# Patient Record
Sex: Male | Born: 1975 | Hispanic: No | Marital: Single | Smoking: Never smoker
Health system: Southern US, Community
[De-identification: ages and names within clinical notes are randomized; demographics above are authoritative.]

---

## 2005-08-22 ENCOUNTER — Emergency Department (HOSPITAL_COMMUNITY): Admission: EM | Admit: 2005-08-22 | Discharge: 2005-08-22 | Payer: Self-pay | Admitting: *Deleted

## 2006-05-31 IMAGING — CT CT HEAD W/O CM
1 of 2 series · 14 of 30 positions shown, 18 images · IV contrast (agent unspecified)
Comparison: None.

CLINICAL DATA: Headache with nausea and vomiting.  
 HEAD CT WITHOUT CONTRAST:
TECHNIQUE: Contiguous axial images were obtained from the base of the skull through the vertex according to standard protocol without contrast.

[Series 2: brain · axial · 0.47mm/px · z∈[+148,+280]mm · 14 of 40 slices shown, 18 images]
[im 3/40  brain]
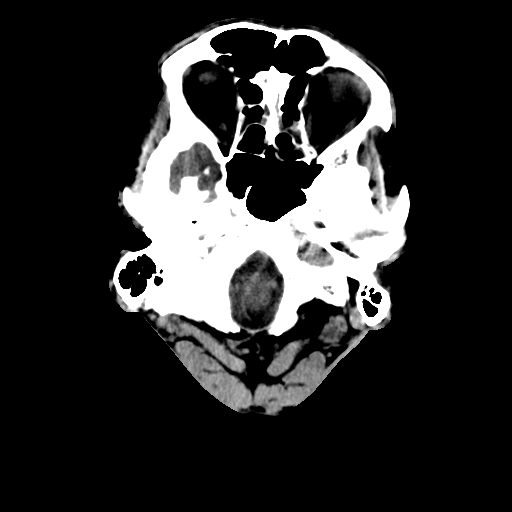
[im 3/40  bone]
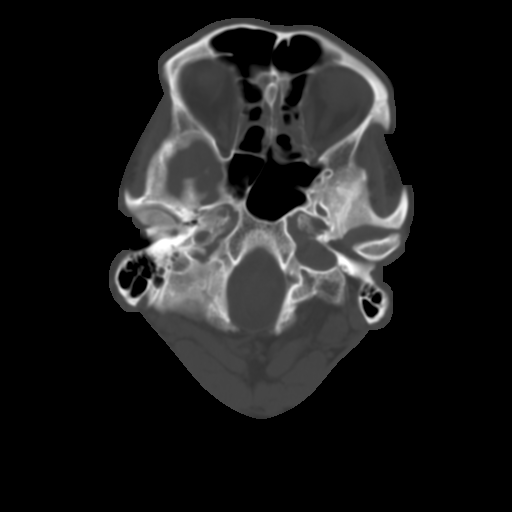
[im 6/40  brain]
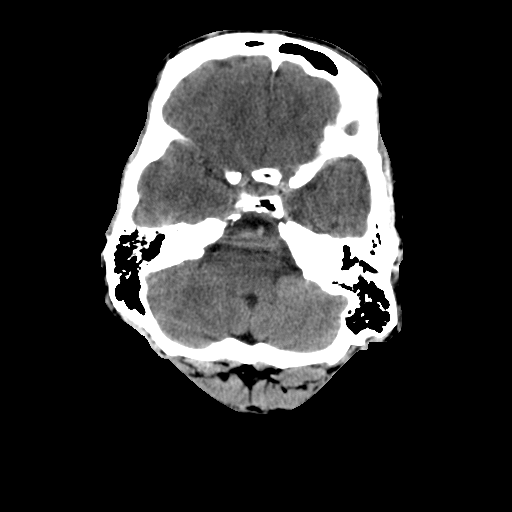
[im 8/40  brain]
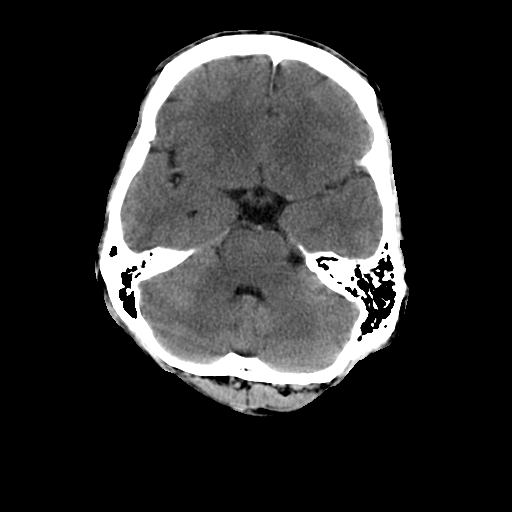
[im 11/40  brain]
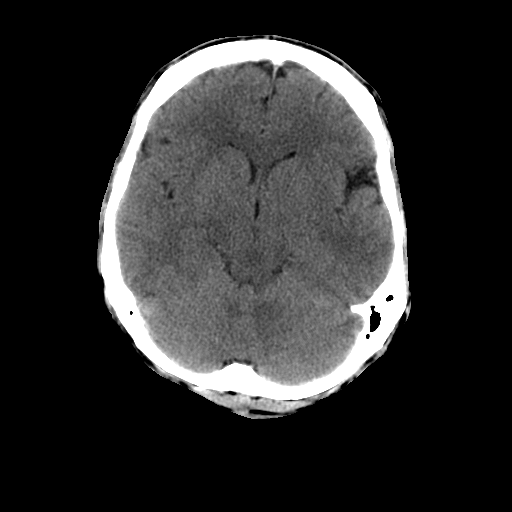
[im 14/40  brain]
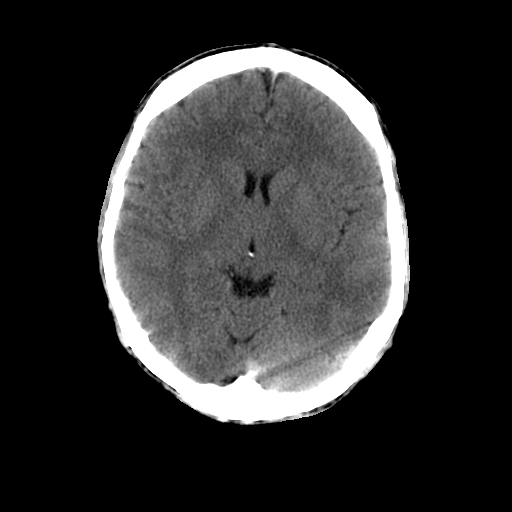
[im 14/40  bone]
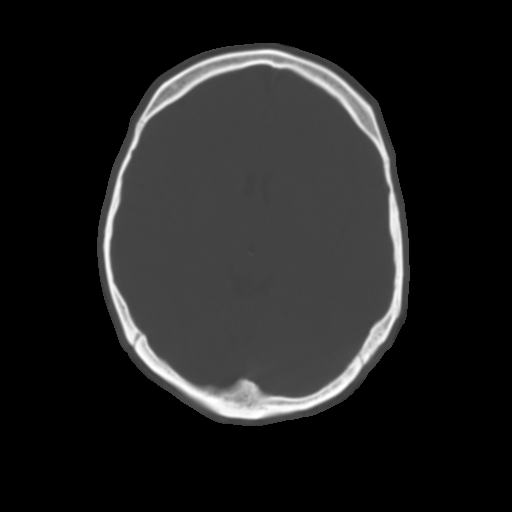
[im 16/40  brain]
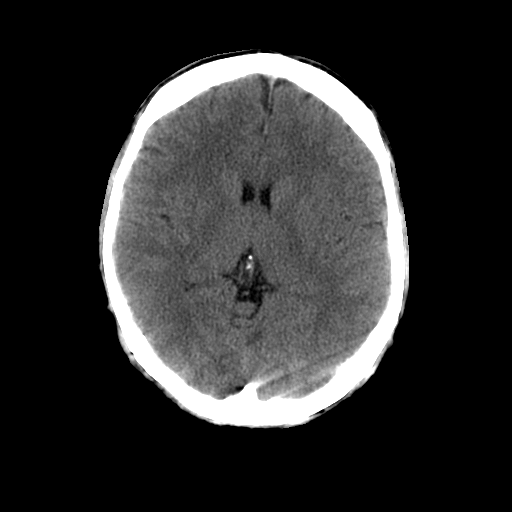
[im 19/40  brain]
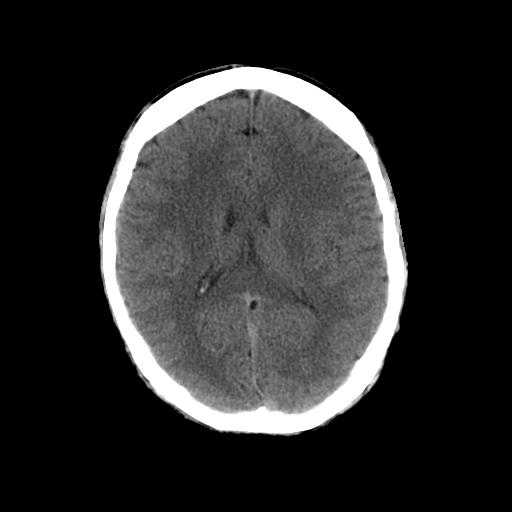
[im 21/40  brain]
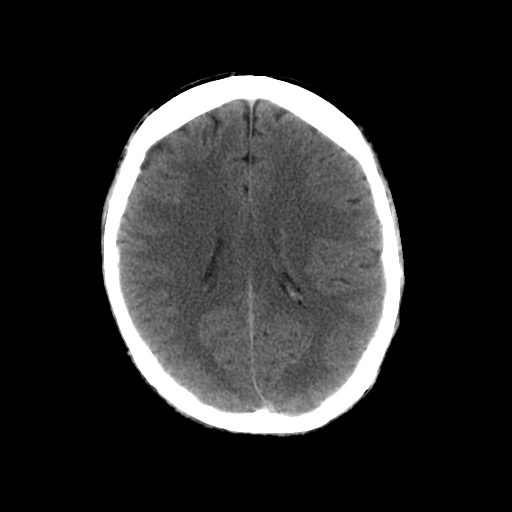
[im 24/40  brain]
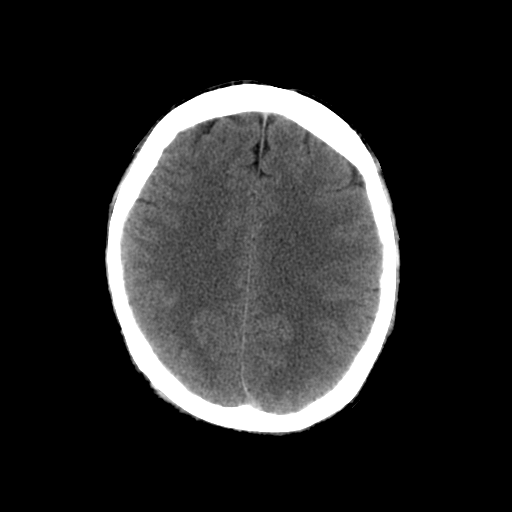
[im 24/40  bone]
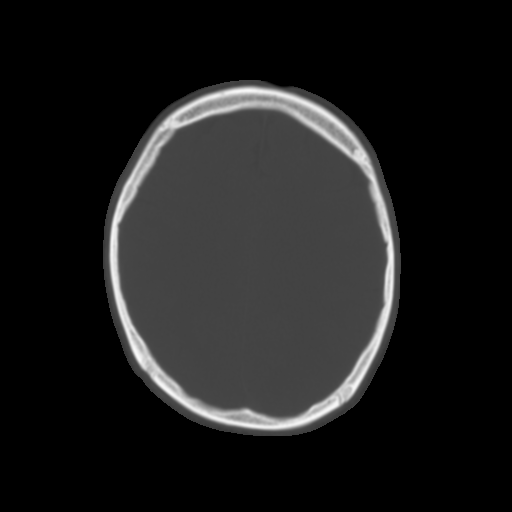
[im 27/40  brain]
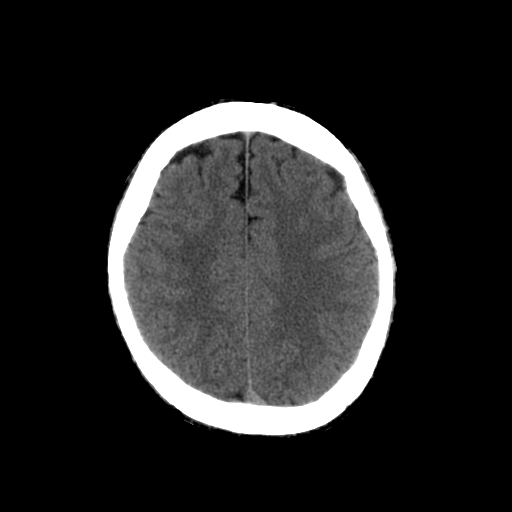
[im 29/40  brain]
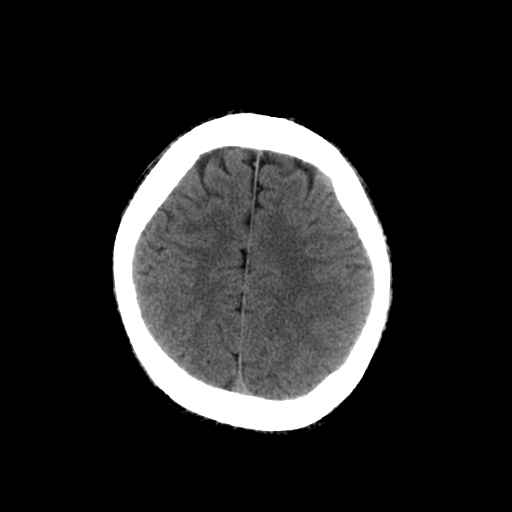
[im 32/40  brain]
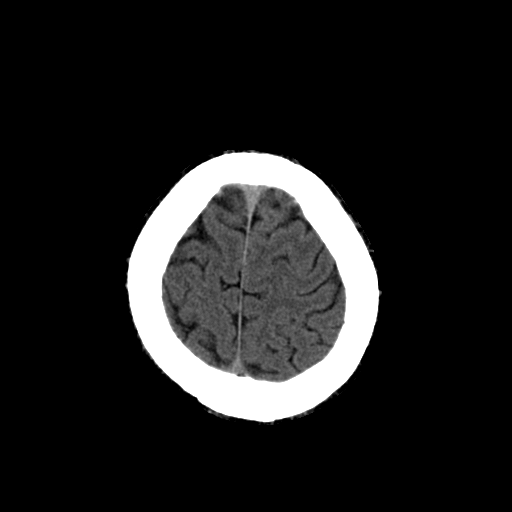
[im 34/40  brain]
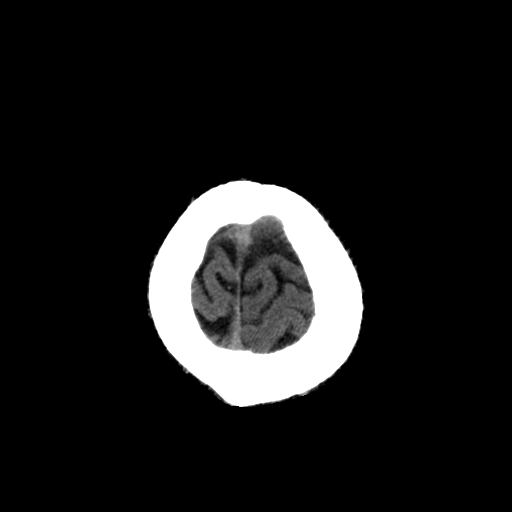
[im 34/40  bone]
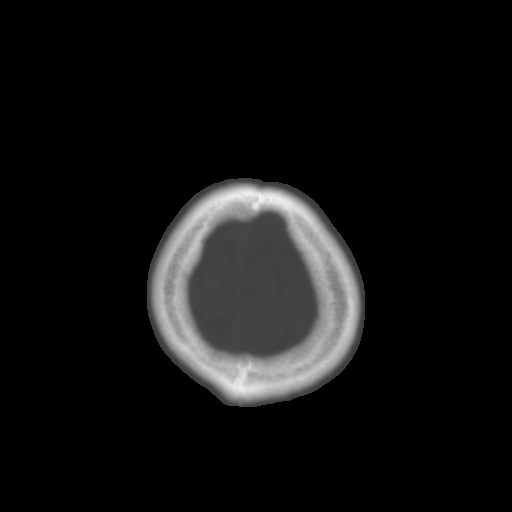
[im 37/40  brain]
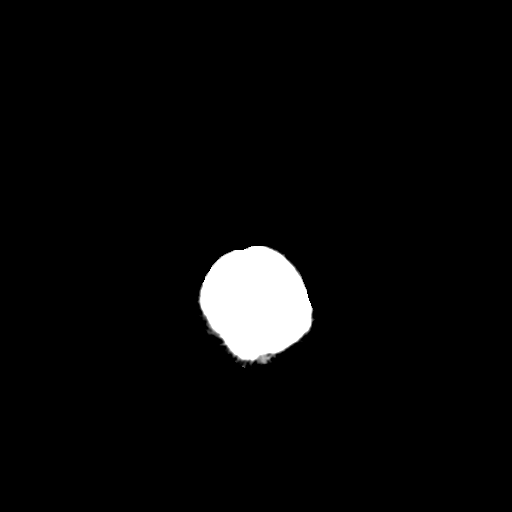

[14 of 30 positions shown; findings below may reference images not displayed]

FINDINGS: No comparison films available. 
 No evidence of acute intracranial abnormality including mass or mass effect, hydrocephalus, extraaxial fluid collection, midline shift, hemorrhage, or infarct.  Acute infarct may be missed by CT for 24 to 48 hours.  Mucosal thickening within scattered bilateral ethmoid air cells are noted.  The remainder of the visualized bony calvarium is unremarkable.
IMPRESSION: 1.  No evidence of acute intracranial abnormality. 
 2.  Ethmoid sinusitis.

## 2007-01-24 ENCOUNTER — Inpatient Hospital Stay (HOSPITAL_COMMUNITY): Admission: EM | Admit: 2007-01-24 | Discharge: 2007-01-25 | Payer: Self-pay | Admitting: Emergency Medicine

## 2007-01-24 ENCOUNTER — Ambulatory Visit: Payer: Self-pay | Admitting: Vascular Surgery

## 2016-01-15 ENCOUNTER — Encounter (HOSPITAL_COMMUNITY): Payer: Self-pay | Admitting: Emergency Medicine

## 2016-01-15 ENCOUNTER — Ambulatory Visit (HOSPITAL_COMMUNITY)
Admission: EM | Admit: 2016-01-15 | Discharge: 2016-01-15 | Disposition: A | Discharge status: Home / Self Care | Payer: BLUE CROSS/BLUE SHIELD | Attending: Emergency Medicine | Admitting: Emergency Medicine

## 2016-01-15 DIAGNOSIS — N390 Urinary tract infection, site not specified: Secondary | ICD-10-CM

## 2016-01-15 LAB — POCT URINALYSIS DIP (DEVICE)
Bilirubin Urine: NEGATIVE
Glucose, UA: NEGATIVE mg/dL
KETONES UR: NEGATIVE mg/dL
Nitrite: POSITIVE — AB
PH: 6 (ref 5.0–8.0)
PROTEIN: 100 mg/dL — AB
SPECIFIC GRAVITY, URINE: 1.025 (ref 1.005–1.030)
UROBILINOGEN UA: 1 mg/dL (ref 0.0–1.0)

## 2016-01-15 MED ORDER — LEVOFLOXACIN 500 MG PO TABS: 500.0000 mg | ORAL_TABLET | Freq: Every day | ORAL | Status: AC

## 2016-01-15 NOTE — ED Notes (Signed)
Patient c/o burning after urination onset yesterday. Patient reports he has had joint pain and fever. Patient is in NAD.

## 2016-01-15 NOTE — Discharge Instructions (Signed)
Antibiotic Medicine °Antibiotic medicines are used to treat infections caused by bacteria. They work by injuring or killing the bacteria that is making you sick. °HOW IS AN ANTIBIOTIC CHOSEN? °An antibiotic is chosen based on many factors. To help your health care provider choose one for you, tell your health care provider if: °· You have any allergies. °· You are pregnant or plan to get pregnant. °· You are breastfeeding. °· You are taking any medicines. These include over-the-counter medicines, prescription medicines, and herbal remedies. °· You have a medical condition or problem you have not already discussed. °Your health care provider will also consider: °· How often the medicine has to be taken. °· Common side effects of the medicine. °· The cost of the medicine. °· The taste of the medicine. °If you have questions about why an antibiotic was chosen, make sure to ask. °FOR HOW LONG SHOULD I TAKE MY ANTIBIOTIC? °Continue to take your antibiotic for as long as told by your health care provider. Do not stop taking it when you feel better. If you stop taking it too soon: °· You may start to feel sick again. °· Your infection may become harder to treat. °· Complications may develop. °WHAT IF I MISS A DOSE? °Try not to miss any doses of medicine. If you miss a dose, take it as soon as possible. However, if it is almost time for the next dose: °· If you are taking 2 doses per day, take the missed dose and the next dose 5 to 6 hours apart. °· If you are taking 3 or more doses per day, take the missed dose and the next dose 2 to 4 hours apart, then go back to the normal schedule. °If you cannot make up a missed dose, take the next scheduled dose on time. Then take the missed dose after you have taken all the doses as recommended by your health care provider, as if you had one more dose left. °DO ANTIBIOTICS AFFECT BIRTH CONTROL? °Birth control pills may not work while you are on antibiotics. If you are taking birth  control pills, continue taking them as usual and use a second form of birth control, such as a condom, to avoid unwanted pregnancy. Continue using the second form of birth control until you are finished with your current 1 month cycle of birth control pills. °OTHER INFORMATION °· If there is any medicine left over, throw it away. °· Never take someone else's antibiotics. °· Never take leftover antibiotics. °SEEK MEDICAL CARE IF: °· You get worse. °· You do not feel better within a few days of starting the antibiotic medicine. °· You vomit. °· White patches appear in your mouth. °· You have new joint pain that begins after starting the antibiotic. °· You have new muscle aches that begin after starting the antibiotic. °· You had a fever before starting the antibiotic and it returns. °· You have any symptoms of an allergic reaction, such as an itchy rash. If this happens, stop taking the antibiotic. °SEEK IMMEDIATE MEDICAL CARE IF: °· Your urine turns dark or becomes blood-colored. °· Your skin turns yellow. °· You bruise or bleed easily. °· You have severe diarrhea and abdominal cramps. °· You have a severe headache. °· You have signs of a severe allergic reaction, such as: °¨ Trouble breathing. °¨ Wheezing. °¨ Swelling of the lips, tongue, or face. °¨ Fainting. °¨ Blisters on the skin or in the mouth. °If you have signs of a severe allergic   reaction, stop taking the antibiotic right away. °  °This information is not intended to replace advice given to you by your health care provider. Make sure you discuss any questions you have with your health care provider. °  °Document Released: 05/19/2004 Document Revised: 05/28/2015 Document Reviewed: 01/22/2015 °Elsevier Interactive Patient Education ©2016 [employer] ° °Urinary Tract Infection °Urinary tract infections (UTIs) can develop anywhere along your urinary tract. Your urinary tract is your body's drainage system for removing wastes and extra water. Your urinary  tract includes two kidneys, two ureters, a bladder, and a urethra. Your kidneys are a pair of bean-shaped organs. Each kidney is about the size of your fist. They are located below your ribs, one on each side of your spine. °CAUSES °Infections are caused by microbes, which are microscopic organisms, including fungi, viruses, and bacteria. These organisms are so small that they can only be seen through a microscope. Bacteria are the microbes that most commonly cause UTIs. °SYMPTOMS  °Symptoms of UTIs may vary by age and gender of the patient and by the location of the infection. Symptoms in young women typically include a frequent and intense urge to urinate and a painful, burning feeling in the bladder or urethra during urination. Older women and men are more likely to be tired, shaky, and weak and have muscle aches and abdominal pain. A fever may mean the infection is in your kidneys. Other symptoms of a kidney infection include pain in your back or sides below the ribs, nausea, and vomiting. °DIAGNOSIS °To diagnose a UTI, your caregiver will ask you about your symptoms. Your caregiver will also ask you to provide a urine sample. The urine sample will be tested for bacteria and white blood cells. White blood cells are made by your body to help fight infection. °TREATMENT  °Typically, UTIs can be treated with medication. Because most UTIs are caused by a bacterial infection, they usually can be treated with the use of antibiotics. The choice of antibiotic and length of treatment depend on your symptoms and the type of bacteria causing your infection. °HOME CARE INSTRUCTIONS °· If you were prescribed antibiotics, take them exactly as your caregiver instructs you. Finish the medication even if you feel better after you have only taken some of the medication. °· Drink enough water and fluids to keep your urine clear or pale yellow. °· Avoid caffeine, tea, and carbonated beverages. They tend to irritate your  bladder. °· Empty your bladder often. Avoid holding urine for long periods of time. °· Empty your bladder before and after sexual intercourse. °· After a bowel movement, women should cleanse from front to back. Use each tissue only once. °SEEK MEDICAL CARE IF:  °· You have back pain. °· You develop a fever. °· Your symptoms do not begin to resolve within 3 days. °SEEK IMMEDIATE MEDICAL CARE IF:  °· You have severe back pain or lower abdominal pain. °· You develop chills. °· You have nausea or vomiting. °· You have continued burning or discomfort with urination. °MAKE SURE YOU:  °· Understand these instructions. °· Will watch your condition. °· Will get help right away if you are not doing well or get worse. °  °This information is not intended to replace advice given to you by your health care provider. Make sure you discuss any questions you have with your health care provider. °  °Document Released: 06/16/2005 Document Revised: 05/28/2015 Document Reviewed: 10/15/2011 °Elsevier Interactive Patient Education ©2016 [employer] ° °

## 2016-01-15 NOTE — ED Provider Notes (Signed)
CSN: [id number]     Arrival date & time 01/15/16  1833 History   First MD Initiated Contact with Patient 01/15/16 2029     No chief complaint on file.  (Consider location/radiation/quality/duration/timing/severity/associated sxs/prior Treatment) HPI History obtained from patient:  Pt presents with the cc HY:[id number]:dysuria Duration of symptoms:2-3 days ago Treatment prior to arrival:juice and water Context:sudden onset Other symptoms include:mild back pain Pain score:4 FAMILY HISTORY:no known cancer history SOCIAL HISTORY:non smoker  No past medical history on file. No past surgical history on file. No family history on file. Social History  Substance Use Topics  . Smoking status: Not on file  . Smokeless tobacco: Not on file  . Alcohol Use: Not on file    Review of Systems ROS +'ve dysuria  Denies: HEADACHE, NAUSEA, ABDOMINAL PAIN, CHEST PAIN, CONGESTION,  SHORTNESS OF BREATH, NO BACK PAIN OR FEVER.  Allergies  Review of patient's allergies indicates not on file.  Home Medications   Prior to Admission medications   Not on File   Meds Ordered and Administered this Visit  Medications - No data to display  There were no vitals taken for this visit. No data found.   Physical Exam NURSES NOTES AND VITAL SIGNS REVIEWED. CONSTITUTIONAL: Well developed, well nourished, no acute distress HEENT: normocephalic, atraumatic EYES: Conjunctiva normal NECK:normal ROM, supple, no adenopathy PULMONARY:No respiratory distress, normal effort ABDOMINAL: Soft, ND, NT BS+, No CVAT, PROSTATE WITHOUT TENDERNESS. MUSCULOSKELETAL: Normal ROM of all extremities,  SKIN: warm and dry without rash PSYCHIATRIC: Mood and affect, behavior are normal  ED Course  Procedures (including critical care time)  Labs Review Labs Reviewed  POCT URINALYSIS DIP (DEVICE) - Abnormal; Notable for the following:    Hgb urine dipstick LARGE (*)    Protein, ur 100 (*)    Nitrite POSITIVE (*)    Leukocytes,  UA LARGE (*)    All other components within normal limits   I HAVE PERSONALLY REVIEWED TESTS RESULTS WITH PATIENT.  Imaging Review No results found.   Visual Acuity Review  Right Eye Distance:   Left Eye Distance:   Bilateral Distance:    Right Eye Near:   Left Eye Near:    Bilateral Near:       rx LEVAQUIN  MDM   1. UTI (lower urinary tract infection)     Patient is reassured that there are no issues that require transfer to higher level of care at this time or additional tests. Patient is advised to continue home symptomatic treatment. Patient is advised that if there are new or worsening symptoms to attend the emergency department, contact primary care provider, or return to UC. Instructions of care provided discharged home in stable condition.    THIS NOTE WAS GENERATED USING A VOICE RECOGNITION SOFTWARE PROGRAM. ALL REASONABLE EFFORTS  WERE MADE TO PROOFREAD THIS DOCUMENT FOR ACCURACY.  I have verbally reviewed the discharge instructions with the patient. A printed AVS was given to the patient.  All questions were answered prior to discharge. \    Tharon AquasFrank C Zamauri Nez, PA 01/15/16 2057

## 2016-01-18 LAB — URINE CULTURE: Culture: >=100000 — AB

## 2016-02-03 ENCOUNTER — Encounter (HOSPITAL_COMMUNITY): Payer: Self-pay | Admitting: *Deleted

## 2016-02-03 ENCOUNTER — Ambulatory Visit (HOSPITAL_COMMUNITY)
Admission: EM | Admit: 2016-02-03 | Discharge: 2016-02-03 | Disposition: A | Discharge status: Home / Self Care | Payer: BLUE CROSS/BLUE SHIELD | Attending: Family Medicine | Admitting: Family Medicine

## 2016-02-03 DIAGNOSIS — N390 Urinary tract infection, site not specified: Secondary | ICD-10-CM | POA: Insufficient documentation

## 2016-02-03 MED ORDER — SULFAMETHOXAZOLE-TRIMETHOPRIM 800-160 MG PO TABS: 1.0000 | ORAL_TABLET | Freq: Two times a day (BID) | ORAL | Status: AC

## 2016-02-03 NOTE — ED Notes (Signed)
Pt   Was    Seen  sev  Weeks  Ago  For  A  Urinary  Problem   He  Reports   Symptoms  Of      Burning  On  Urination  And  The  Urine  Has  A  dargk  Color   He  Is  Able  To pass  The  Urine  Satisfactory

## 2016-02-03 NOTE — ED Provider Notes (Addendum)
CSN: [id number]     Arrival date & time 02/03/16  1836 History   First MD Initiated Contact with Patient 02/03/16 1957     Chief Complaint  Patient presents with  . Urinary Tract Infection   (Consider location/radiation/quality/duration/timing/severity/associated sxs/prior Treatment) Patient is a 40 y.o. male presenting with urinary tract infection. The history is provided by the patient.  Urinary Tract Infection This is a new problem. The current episode started 3 to 5 hours ago (seen 1 mo ago with ecoli uti sx and improved with abx, sx relapsed today, here for rx.). The problem has not changed since onset.Pertinent negatives include no abdominal pain.    History reviewed. No pertinent past medical history. History reviewed. No pertinent past surgical history. No family history on file. Social History  Substance Use Topics  . Smoking status: Never Smoker   . Smokeless tobacco: None  . Alcohol Use: No    Review of Systems  Constitutional: Negative for fever and chills.  Gastrointestinal: Negative.  Negative for abdominal pain.  Genitourinary: Positive for dysuria, frequency and hematuria. Negative for urgency and flank pain.  All other systems reviewed and are negative.   Allergies  Review of patient's allergies indicates no known allergies.  Home Medications   Prior to Admission medications   Medication Sig Start Date End Date Taking? Authorizing Provider  levofloxacin (LEVAQUIN) 500 MG tablet Take 1 tablet (500 mg total) by mouth daily. 01/15/16   Tharon AquasFrank C Patrick, PA   Meds Ordered and Administered this Visit  Medications - No data to display  BP 129/90 mmHg  Pulse 99  Temp(Src) 98.3 F (36.8 C) (Oral)  Resp 16  SpO2 99% No data found.   Physical Exam  Constitutional: He is oriented to person, place, and time. He appears well-developed and well-nourished.  Abdominal: Soft. Bowel sounds are normal. He exhibits no mass. There is no tenderness. There is no rebound  and no guarding.  Neurological: He is alert and oriented to person, place, and time.  Skin: Skin is warm and dry.  Nursing note and vitals reviewed.   ED Course  Procedures (including critical care time)  Labs Review Labs Reviewed  URINE CULTURE    Imaging Review No results found.   Visual Acuity Review  Right Eye Distance:   Left Eye Distance:   Bilateral Distance:    Right Eye Near:   Left Eye Near:    Bilateral Near:         MDM  No diagnosis found. Meds ordered this encounter  Medications  . sulfamethoxazole-trimethoprim (BACTRIM DS,SEPTRA DS) 800-160 MG tablet    Sig: Take 1 tablet by mouth 2 (two) times daily.    Dispense:  20 tablet    Refill:  0       Linna HoffJames D Bessie Boyte, MD 02/03/16 2015  Linna HoffJames D Ginny Loomer, MD 02/03/16 2016

## 2016-02-03 NOTE — Discharge Instructions (Signed)
Call urology office at 8am for follow-up appt. Drink plenty of fluids, take all of medicine.

## 2016-02-06 LAB — URINE CULTURE
